# Patient Record
Sex: Female | Born: 1960 | Race: Black or African American | Hispanic: No | Smoking: Current every day smoker
Health system: Southern US, Community
[De-identification: ages and names within clinical notes are randomized; demographics above are authoritative.]

## PROBLEM LIST (undated history)

## (undated) DIAGNOSIS — D219 Benign neoplasm of connective and other soft tissue, unspecified: Secondary | ICD-10-CM

## (undated) DIAGNOSIS — E119 Type 2 diabetes mellitus without complications: Secondary | ICD-10-CM

## (undated) HISTORY — PX: ABDOMINAL HYSTERECTOMY: SHX81

---

## 2000-10-26 ENCOUNTER — Emergency Department (HOSPITAL_COMMUNITY): Admission: EM | Admit: 2000-10-26 | Discharge: 2000-10-26 | Payer: Self-pay | Admitting: Emergency Medicine

## 2000-11-11 ENCOUNTER — Emergency Department (HOSPITAL_COMMUNITY): Admission: EM | Admit: 2000-11-11 | Discharge: 2000-11-11 | Payer: Self-pay

## 2001-12-05 ENCOUNTER — Emergency Department (HOSPITAL_COMMUNITY): Admission: EM | Admit: 2001-12-05 | Discharge: 2001-12-05 | Payer: Self-pay | Admitting: Emergency Medicine

## 2002-01-04 ENCOUNTER — Encounter: Admission: RE | Admit: 2002-01-04 | Discharge: 2002-01-04 | Payer: Self-pay

## 2002-01-26 ENCOUNTER — Emergency Department (HOSPITAL_COMMUNITY): Admission: EM | Admit: 2002-01-26 | Discharge: 2002-01-26 | Payer: Self-pay | Admitting: Emergency Medicine

## 2002-08-16 ENCOUNTER — Encounter: Payer: Self-pay | Admitting: *Deleted

## 2002-08-16 ENCOUNTER — Inpatient Hospital Stay (HOSPITAL_COMMUNITY): Admission: AD | Admit: 2002-08-16 | Discharge: 2002-08-16 | Payer: Self-pay | Admitting: Obstetrics and Gynecology

## 2002-09-26 ENCOUNTER — Encounter: Admission: RE | Admit: 2002-09-26 | Discharge: 2002-09-26 | Payer: Self-pay | Admitting: Obstetrics and Gynecology

## 2002-12-09 ENCOUNTER — Inpatient Hospital Stay (HOSPITAL_COMMUNITY): Admission: RE | Admit: 2002-12-09 | Discharge: 2002-12-11 | Payer: Self-pay | Admitting: Obstetrics and Gynecology

## 2002-12-09 ENCOUNTER — Encounter (INDEPENDENT_AMBULATORY_CARE_PROVIDER_SITE_OTHER): Payer: Self-pay

## 2006-05-11 ENCOUNTER — Emergency Department (HOSPITAL_COMMUNITY): Admission: EM | Admit: 2006-05-11 | Discharge: 2006-05-11 | Payer: Self-pay | Admitting: Emergency Medicine

## 2006-05-20 ENCOUNTER — Emergency Department (HOSPITAL_COMMUNITY): Admission: EM | Admit: 2006-05-20 | Discharge: 2006-05-20 | Payer: Self-pay | Admitting: Emergency Medicine

## 2006-06-15 ENCOUNTER — Emergency Department (HOSPITAL_COMMUNITY): Admission: EM | Admit: 2006-06-15 | Discharge: 2006-06-15 | Payer: Self-pay | Admitting: Emergency Medicine

## 2007-09-21 ENCOUNTER — Emergency Department (HOSPITAL_COMMUNITY): Admission: EM | Admit: 2007-09-21 | Discharge: 2007-09-21 | Payer: Self-pay | Admitting: Family Medicine

## 2009-09-08 ENCOUNTER — Emergency Department (HOSPITAL_COMMUNITY): Admission: EM | Admit: 2009-09-08 | Discharge: 2009-09-08 | Payer: Self-pay | Admitting: Family Medicine

## 2009-11-06 ENCOUNTER — Emergency Department (HOSPITAL_COMMUNITY): Admission: EM | Admit: 2009-11-06 | Discharge: 2009-11-06 | Payer: Self-pay | Admitting: Emergency Medicine

## 2009-12-13 ENCOUNTER — Emergency Department (HOSPITAL_COMMUNITY): Admission: EM | Admit: 2009-12-13 | Discharge: 2009-12-13 | Payer: Self-pay | Admitting: Family Medicine

## 2010-01-21 ENCOUNTER — Encounter: Admission: RE | Admit: 2010-01-21 | Discharge: 2010-01-21 | Payer: Self-pay | Admitting: Infectious Diseases

## 2010-01-25 ENCOUNTER — Ambulatory Visit: Payer: Self-pay | Admitting: Internal Medicine

## 2010-01-27 ENCOUNTER — Ambulatory Visit: Payer: Self-pay | Admitting: Internal Medicine

## 2010-02-24 ENCOUNTER — Ambulatory Visit: Payer: Self-pay | Admitting: Internal Medicine

## 2010-02-24 LAB — CONVERTED CEMR LAB
ALT: 10 units/L (ref 0–35)
AST: 12 units/L (ref 0–37)
BUN: 12 mg/dL (ref 6–23)
CO2: 17 meq/L — ABNORMAL LOW (ref 19–32)
Chloride: 106 meq/L (ref 96–112)
Cholesterol: 151 mg/dL (ref 0–200)
Eosinophils Relative: 1 % (ref 0–5)
Hemoglobin: 13.3 g/dL (ref 12.0–15.0)
MCHC: 30.9 g/dL (ref 30.0–36.0)
Monocytes Absolute: 0.4 10*3/uL (ref 0.1–1.0)
Monocytes Relative: 6 % (ref 3–12)
Neutro Abs: 2.9 10*3/uL (ref 1.7–7.7)
Platelets: 328 10*3/uL (ref 150–400)
RBC: 4.83 M/uL (ref 3.87–5.11)
RDW: 14.6 % (ref 11.5–15.5)
Sodium: 140 meq/L (ref 135–145)
Total Bilirubin: 0.8 mg/dL (ref 0.3–1.2)
Total Protein: 6.8 g/dL (ref 6.0–8.3)
Triglycerides: 51 mg/dL (ref <150)

## 2010-03-31 ENCOUNTER — Ambulatory Visit: Payer: Self-pay | Admitting: Internal Medicine

## 2010-07-28 ENCOUNTER — Ambulatory Visit: Payer: Self-pay | Admitting: Internal Medicine

## 2011-03-16 LAB — POCT URINALYSIS DIP (DEVICE)
Bilirubin Urine: NEGATIVE
Hgb urine dipstick: NEGATIVE
Nitrite: NEGATIVE
Protein, ur: NEGATIVE mg/dL
Specific Gravity, Urine: 1.015 (ref 1.005–1.030)
Urobilinogen, UA: 1 mg/dL (ref 0.0–1.0)

## 2011-04-29 NOTE — Op Note (Signed)
Valerie Sanders, VANSTONE                            ACCOUNT NO.:  [id number]   MEDICAL RECORD NO.:  [id number]                   PATIENT TYPE:  INP   LOCATION:  9127                                 FACILITY:  [facility]   PHYSICIAN:  Phil D. Okey Dupre, M.D.                  DATE OF BIRTH:  [birth year]   DATE OF PROCEDURE:  12/09/2002  DATE OF DISCHARGE:                                 OPERATIVE REPORT   PREOPERATIVE DIAGNOSES:  1. Menorrhagia.  2. Dysmenorrhea.  3. Symptomatic fibroids.   POSTOPERATIVE DIAGNOSES:  1. Menorrhagia.  2. Dysmenorrhea.  3. Symptomatic fibroids.  4. Pending pathology report.   PROCEDURE:  Total vaginal hysterectomy.   SURGEON:  Javier Glazier. Okey Dupre, M.D.   FIRST ASSISTANT:  Mary Sella. Orlene Erm, M.D.   SECOND ASSISTANT:  Operating room nurse.   FINDINGS:  A slightly irregular uterus smaller than described by sonography  and soft in texture.   DESCRIPTION OF PROCEDURE:  Under satisfactory general anesthesia with the  patient in the dorsal lithotomy position, the perineum and vagina were  prepped and draped in the usual sterile manner.  Weighted speculum was  placed in the posterior portion of the vagina through meatal introitus.  The  BUS was within normal limits.  The vagina was clean and well rugated.  The  cervix was parous and clean with a first degree prolapse. The cervix was  grasped by the anterior and posterior lip each with a Lahey clamp and the  mucosa around the cervix was injected with a total of 10 cc 1% Xylocaine to  1:100,000 epinephrine.  Circumferential incision was then made around the  entire circumference of the cervix 1 cm from its distal end.  Vaginal mucosa  was then pushed away from the distal end of the cervix by blunt dissection.  The cul-de-sac at [facility] was entered by sharp dissection and a figure-of-  eight suture placed through this through the vaginal mucosa and through the  peritoneum for hemostasis and traction.  Circumferentially, using  Lahey  clamps, the uterine vessels were clamped, divided and ligated with warm  chromic catgut suture ligatures.  These pedicles included (1) the  uterosacral ligaments, (2) cardinal ligaments, (3) the packet containing  uterine vessels, and (4) tissue between the area of the uterine vessels  containing a portion of the broad ligament up to the round ligament.  At  this point, the peritoneal cavity was reentered beneath the bladder and free  tie placed through this opening and tied around the remaining pedicle which  included the round ligament and the fallopian tubes and the utero-ovarian  ligament.  A Heaney clamp was then placed medial to the aforementioned tie.  The tissue medially was divided, thus removing the uterus in toto.  The  lateral pedicles were religated with #1 chromic catgut suture ligatures.  The area was observed for bleeding.  None  was noted and the peritoneum was  closed with a pursestring suture of one chromic catgut.  The vaginal mucosa  was then closed with interrupted figure-of-eight sutures for hemostasis.  The uterosacral ligaments were then tied in the midline with the sutures  that had been held long and then these were cut short.  The same was done  with utero-ovarian, these were cut short.  A vaginal pack was placed in the  vagina with Iodoform gauze.  A Foley catheter in the urinary bladder  draining clear amber urine at the end of the procedure.  The total blood  loss during the procedure was 100 cc.  The sponge, needle and instrument  counts were correct at the end of the procedure.  The patient was  transferred to the recovery room in satisfactory condition.  Description of  specimens removed.  As aforementioned, the uterus was slightly smaller than  had been appreciated on sonogram, was generally normal shape.  There was  some small globular out pocketings of small fibroids.  Otherwise, appeared  normal.                                                Phil D. Okey Dupre, M.D.    PDR/MEDQ  D:  12/09/2002  T:  12/09/2002  Job:  [id number]

## 2011-04-29 NOTE — H&P (Signed)
Valerie Sanders, Valerie Sanders                            ACCOUNT NO.:  [id number]   MEDICAL RECORD NO.:  [id number]                   PATIENT TYPE:  INP   LOCATION:  NA                                   FACILITY:  [facility]   PHYSICIAN:  Phil D. Okey Dupre, M.D.                  DATE OF BIRTH:  1961-11-26   DATE OF ADMISSION:  12/09/2002  DATE OF DISCHARGE:                                HISTORY & PHYSICAL   CHIEF COMPLAINT:  Severe painful periods.   HISTORY OF PRESENT ILLNESS:  This is a 50 year old gravida 3 para 3-0-0-3  who has had disabling periods that requires going to bed over the past nine  months.  The patient is a heavy smoker so oral contraceptives could not be  used to try and control the pain and they were unhelped by NSAIDS.  The  patient is being admitted for a vaginal hysterectomy.  A sonogram shows a 4  cm fibroid on the uterus.   ALLERGIES:  The patient has no allergies known.   MEDICATIONS:  She is on no medications on a regular basis.   PAST HISTORY:  Bilateral tubal ligation and three pregnancies with normal  vaginal deliveries.   FAMILY HISTORY:  Parents are living and well as well as several brothers and  sisters.  She has a family history of hypertension but all are in fairly  good health.   REVIEW OF SYSTEMS:  GENERAL:  Overall stated health is good.  She has  fatigue which she explains by her weight and her pain with periods.  She  does not run fevers and have night sweats.  She is able to carry out normal  activities with the exception of the time she is having a period.  INTEGUMENTARY:  No sign of eruptions, rashes, pruritus, pigmentation,  unusual hair growth or disorders, deformity of nails.  HEAD:  No history of  headaches, no head trauma history.  EYES:  No visual problems, no lid edema,  no excessive tearing, no dry eyes.  EARS:  Hearing is normal.  No history of  discharge.  NOSE:  No history of nose bleeds or sinusitis.  No history of  nasal obstruction.   Normal smell.  MOUTH:  Teeth and gums with fairly poor  dentition; it has been over two years since she saw a dentist.  THROAT AND  NECK:  Rare sore throat.  No neck stiffness or pain.  BREASTS:  No  discharge, no bleeding, and no lumps being noticed.  RESPIRATORY:  No  history of wheezing or shortness of breath.  No orthopnea, no heavy sputum  production.  CARDIOVASCULAR:  No history of chest pain, palpitations, heart  murmur, peripheral edema, hypertension, or thrombophlebitis.  GASTROINTESTINAL:  No dysphagia, no loss of appetite.  Normal dietary  habits.  No constant laxative.  Normal bowel movement every day.  GENITOURINARY:  Occasional urinary frequency but no dysuria.  Occasional  nocturia once or twice a night.  No urinary incontinence.  GYNECOLOGICAL:  See present illness.  OBSTETRICAL:  Three full-term pregnancies.  No  abortions, no miscarriages.  MUSCULOSKELETAL:  No weakness or neck pain.  Occasional joint stiffness.  NEUROPSYCHIATRIC:  No seizures.  No  paresthesias, hyperesthesias.  No depression, no nervousness.  LYMPHATIC:  No lymph node swelling.  No bruising, edema.  No history of blood  transfusions.  ENDOCRINE:  No signs of hyperthyroidism or hypothyroidism.  No history of diabetes.  No polydipsia or polyuria.  No excessive sweating  and no voice changes.   PHYSICAL EXAMINATION:  VITAL SIGNS:  Temperature 98.8, respirations 14 per  minute, pulse 68 per minute, blood pressure 120/60.  GENERAL:  She is a well-developed, well-nourished black female in no  apparent distress.  HEENT:  Within normal limits.  NECK:  Supple.  Thyroid is symmetrical with no masses.  LUNGS:  Clear to auscultation and percussion.  HEART:  There is no murmur.  Normal sinus rhythm.  Point of maximum impulse  is the fifth intercostal space in mid clavicular line.  BREASTS:  Symmetrical with no dominant mass and no nipple discharge noted.  ABDOMEN:  Soft, flat, nontender.  No masses, no  organomegaly.  No adenopathy  noted.  PELVIC:  Genitalia:  External within normal limits.  Introitus is marital.  The vagina is clean and well rugated.  The cervix is parous and clean.  The  uterus is retroverted, slightly enlarged and irregular.  Adnexa is normal.  RECTAL:  The rectum was normal with no masses and a negative hemoccult.   SPECIAL ASSESSMENT:  A sonogram showed a 4 cm fibroid.   IMPRESSION:  Symptomatic leiomyomata uteri with chronic disabling  dysmenorrhea.   PLAN:  Vaginal hysterectomy, possible abdominal hysterectomy.                                               Phil D. Okey Dupre, M.D.    PDR/MEDQ  D:  12/07/2002  T:  12/07/2002  Job:  [id number]

## 2011-04-29 NOTE — Discharge Summary (Signed)
   Valerie Sanders, Valerie Sanders                            ACCOUNT NO.:  [id number]   MEDICAL RECORD NO.:  [id number]                   PATIENT TYPE:  INP   LOCATION:  9127                                 FACILITY:  [facility]   PHYSICIAN:  Phil D. Okey Dupre, M.D.                  DATE OF BIRTH:  [birth year]   DATE OF ADMISSION:  12/09/2002  DATE OF DISCHARGE:  12/11/2002                                 DISCHARGE SUMMARY   HISTORY OF PRESENT ILLNESS:  The patient is a 50 year old black female who  was admitted for vaginal hysterectomy which was done on the day of  admission.  She had a long history of disabling dysmenorrhea as well as  menorrhagia with secondary anemia.  The patient's admission hemoglobin was  7.9 and at discharge was 6.5.  The patient has done extremely well with an  afebrile postoperative course.  Physical examination at discharge the  abdomen is soft, flat, nontender with no guarding or rebound.  No genital  bleeding.  The patient is ambulating well and has been using minimal amount  of pain medication.  At the time of discharge the postoperative pathology  has not yet been completed.  The postoperative diagnosis was symptomatic  fibroids.  My impression is that the patient possibly had adenomyosis.   CONDITION ON DISCHARGE:  Excellent.   DISCHARGE INSTRUCTIONS:  Instructions have been given to the patient as far  as activity, especially to reduce the heavy lifting and stairs for the first  two weeks at home.  She works in a [employer] and we told her she could  probably go to work after the first of February.  She is being discharged  with Percocet and Motrin for pain, iron supplement for anemia.  As the  patient wishes, we will prescribe Wellbutrin as she is a two-pack a day  smoker who is really motivated, I think, to stop smoking.   In review, the patient had a total vaginal hysterectomy on the day of  admission.  This is her second postoperative day.  She has done very well.  She  has persistent anemia, the major portion which was preoperative and will  be followed in the GYN Clinic in four weeks.                                               Phil D. Okey Dupre, M.D.    PDR/MEDQ  D:  12/11/2002  T:  12/11/2002  Job:  [id number]

## 2011-11-09 ENCOUNTER — Emergency Department (HOSPITAL_COMMUNITY)
Admission: EM | Admit: 2011-11-09 | Discharge: 2011-11-09 | Disposition: A | Discharge status: Home / Self Care | Payer: Self-pay | Source: Home / Self Care | Attending: Emergency Medicine | Admitting: Emergency Medicine

## 2011-11-09 DIAGNOSIS — J029 Acute pharyngitis, unspecified: Secondary | ICD-10-CM

## 2011-11-09 HISTORY — DX: Benign neoplasm of connective and other soft tissue, unspecified: D21.9

## 2011-11-09 LAB — POCT RAPID STREP A: Streptococcus, Group A Screen (Direct): NEGATIVE

## 2011-11-09 MED ORDER — ACETAMINOPHEN 80 MG/0.8ML PO SUSP
1000.0000 mg | Freq: Once | ORAL | Status: AC
  Administered 2011-11-09: 1000 mg via ORAL

## 2011-11-09 MED ORDER — GI COCKTAIL ~~LOC~~
30.0000 mL | Freq: Once | ORAL | Status: AC
  Administered 2011-11-09: 30 mL via ORAL

## 2011-11-09 MED ORDER — FAMOTIDINE 20 MG PO TABS: 20.0000 mg | ORAL_TABLET | Freq: Every day | ORAL | Status: AC

## 2011-11-09 MED ORDER — SUCRALFATE 1 GM/10ML PO SUSP: 0.5000 g | Freq: Four times a day (QID) | ORAL | Status: AC

## 2011-11-09 MED ORDER — FIRST-DUKES MOUTHWASH MT SUSP
10.0000 mL | Freq: Four times a day (QID) | OROMUCOSAL | Status: AC | PRN
Start: 2011-11-09 — End: ?

## 2011-11-09 MED ORDER — GI COCKTAIL ~~LOC~~
ORAL | Status: AC
  Filled 2011-11-09: qty 30

## 2011-11-09 NOTE — ED Provider Notes (Signed)
History     CSN: [id number] Arrival date & time: 11/09/2011  5:49 PM   First MD Initiated Contact with Patient 11/09/11 1746      Chief Complaint  Patient presents with  . Sore Throat    (Consider location/radiation/quality/duration/timing/severity/associated sxs/prior treatment) HPI Comments: Pt reports prolonged episodes of coughing last night from URI/postnasal drip that she has had for approx a week. Today woke up with severe sore throat, pain with swallowing. No CP, SOB, wheezing, no voice changes, fevers, drooling, inability to swallow, neck pain, facial swelling, neck swelling. No rash, abd pain. No known sick contacts. Pt is smoker. H/o intranasal cocaine, heroin use. Denies use in 2 years.   Patient is a 50 y.o. female presenting with pharyngitis. The history is provided by the patient.  Sore Throat This is a new problem. The current episode started 6 to 12 hours ago. The problem occurs constantly. The problem has not changed since onset.Pertinent negatives include no chest pain, no abdominal pain, no headaches and no shortness of breath. The symptoms are aggravated by swallowing. The symptoms are relieved by nothing. She has tried nothing for the symptoms.    Past Medical History  Diagnosis Date  . Fibroids     History reviewed. No pertinent past surgical history.  History reviewed. No pertinent family history.  History  Substance Use Topics  . Smoking status: Current Everyday Smoker  . Smokeless tobacco: Not on file  . Alcohol Use: No    OB History    Grav Para Term Preterm Abortions TAB SAB Ect Mult Living                  Review of Systems  Constitutional: Negative for fatigue.  HENT: Positive for sore throat, rhinorrhea, trouble swallowing and postnasal drip. Negative for ear pain, facial swelling, mouth sores, neck pain, dental problem and voice change.   Respiratory: Negative for shortness of breath and wheezing.   Cardiovascular: Negative for chest  pain.  Gastrointestinal: Negative for abdominal pain.  Skin: Negative for rash.  Neurological: Negative for headaches.    Allergies  Review of patient's allergies indicates no known allergies.  Home Medications   Current Outpatient Rx  Name Route Sig Dispense Refill  . FIRST-DUKES MOUTHWASH MT SUSP Mouth/Throat Use as directed 10 mLs in the mouth or throat 4 (four) times daily as needed. 237 mL 0  . FAMOTIDINE 20 MG PO TABS Oral Take 1 tablet (20 mg total) by mouth daily. 20 tablet 0  . SUCRALFATE 1 GM/10ML PO SUSP Oral Take 5 mLs (0.5 g total) by mouth 4 (four) times daily. 240 mL 0    BP 115/70  Pulse 90  Temp(Src) 97.9 F (36.6 C) (Oral)  Resp 16  SpO2 99%  Physical Exam  Nursing note and vitals reviewed. Constitutional: She is oriented to person, place, and time. She appears well-developed and well-nourished. No distress.  HENT:  Head: Normocephalic and atraumatic.  Right Ear: Tympanic membrane normal.  Left Ear: Tympanic membrane normal.  Nose: Mucosal edema, rhinorrhea and nasal deformity present.  Mouth/Throat: Uvula is midline and mucous membranes are normal. No uvula swelling. Posterior oropharyngeal erythema present. No oropharyngeal exudate, posterior oropharyngeal edema or tonsillar abscesses.       Perforated septum. No sinus tenderness.   Eyes: Conjunctivae and EOM are normal. Pupils are equal, round, and reactive to light.  Neck: Normal range of motion. Neck supple.       Pharynx widely patent.   Cardiovascular: Normal  rate, regular rhythm and normal heart sounds.   Pulmonary/Chest: Effort normal and breath sounds normal.  Abdominal: She exhibits no distension.  Musculoskeletal: Normal range of motion.  Lymphadenopathy:    She has no cervical adenopathy.  Neurological: She is alert and oriented to person, place, and time.  Skin: Skin is warm and dry.  Psychiatric: She has a normal mood and affect. Her behavior is normal. Judgment and thought content  normal.    ED Course  Procedures (including critical care time)   Labs Reviewed  POCT RAPID STREP A ([facility] ONLY)   No results found.   1. Pharyngitis   2. Esophagitis     Results for orders placed during the hospital encounter of 11/09/11  POCT RAPID STREP A ([facility] ONLY)      Component Value Range   Streptococcus, Group A Screen (Direct) NEGATIVE  NEGATIVE      MDM  Appears to be irritation from prolonged coughing from postnasal drip. Also suspect may have had some reflux with increased intraabd pressure. Strep neg, no evidence of mono, Lungs, pharynx clear, normal voice. txing with sucralafate and H2 blocker, [facility]. Will refer to PMD.   Luiz Blare, MD 11/09/11 Windell Moment

## 2011-11-09 NOTE — ED Notes (Signed)
C/o St since this AM, c/o she cannot eat or drink and can not swallow ; no observable drooling, handling secretions well, post nasopharynx reddened, minor swelling noted, no exudate observed; c/o her throat hurts from top to her stomach  ; has not taken any pain medication at home for this problem

## 2012-04-12 ENCOUNTER — Emergency Department (HOSPITAL_COMMUNITY)
Admission: EM | Admit: 2012-04-12 | Discharge: 2012-04-12 | Discharge status: Against Medical Advice | Payer: Self-pay | Source: Home / Self Care

## 2012-04-12 NOTE — ED Notes (Signed)
Patient not in treatment room, in bathroom 

## 2012-04-12 NOTE — ED Notes (Signed)
Pt stated she had to leave to go to work. RN Selena Batten informed

## 2012-10-01 ENCOUNTER — Emergency Department (INDEPENDENT_AMBULATORY_CARE_PROVIDER_SITE_OTHER)
Admission: EM | Admit: 2012-10-01 | Discharge: 2012-10-01 | Disposition: A | Discharge status: Home / Self Care | Payer: Self-pay | Source: Home / Self Care | Attending: Emergency Medicine | Admitting: Emergency Medicine

## 2012-10-01 ENCOUNTER — Encounter (HOSPITAL_COMMUNITY): Payer: Self-pay | Admitting: *Deleted

## 2012-10-01 DIAGNOSIS — N390 Urinary tract infection, site not specified: Secondary | ICD-10-CM

## 2012-10-01 HISTORY — DX: Type 2 diabetes mellitus without complications: E11.9

## 2012-10-01 LAB — POCT URINALYSIS DIP (DEVICE): Nitrite: NEGATIVE

## 2012-10-01 MED ORDER — CEPHALEXIN 500 MG PO CAPS: 500.0000 mg | ORAL_CAPSULE | Freq: Three times a day (TID) | ORAL | Status: AC

## 2012-10-01 NOTE — ED Notes (Signed)
PT  REPORTS  SYMPTOMS  OF  PAINFULL  FREQUENT  URINATION     IN  WHICH  SHE  NOTICED  BLOOD     X  2  DAYS        SHE  DENYS  ANY OTHER  SYMPTOMS  SHE  IS  AWAKE  AS  WELL  AS  ALE RT  AND  PLEASANT  SHE  IS  IN NO    SEVERE  DISTRESS

## 2012-10-01 NOTE — ED Provider Notes (Signed)
History     CSN: [id number]  Arrival date & time 10/01/12  4098   First MD Initiated Contact with Patient 10/01/12 0932      Chief Complaint  Patient presents with  . Urinary Frequency    (Consider location/radiation/quality/duration/timing/severity/associated sxs/prior treatment) HPI Comments: Patient presents urgent care complaining of discomfort and increased frequency with urination for the last 2 days. Describes no fevers, no vomiting or flank pain. She denies any abdominal pain, changes in appetite, vaginal discharge or bleeding.  Patient is a 51 y.o. female presenting with frequency. The history is provided by the patient.  Urinary Frequency This is a new problem. The current episode started 2 days ago. The problem occurs constantly. The problem has not changed since onset.Pertinent negatives include no abdominal pain. Exacerbated by: Urinating. Nothing relieves the symptoms. She has tried nothing for the symptoms. The treatment provided no relief.    Past Medical History  Diagnosis Date  . Fibroids   . Diabetes mellitus without complication     Past Surgical History  Procedure Date  . Abdominal hysterectomy     No family history on file.  History  Substance Use Topics  . Smoking status: Current Every Day Smoker  . Smokeless tobacco: Not on file  . Alcohol Use: No    OB History    Grav Para Term Preterm Abortions TAB SAB Ect Mult Living                  Review of Systems  Constitutional: Negative for fever, chills, diaphoresis, activity change, appetite change and fatigue.  Gastrointestinal: Negative for abdominal pain.  Genitourinary: Positive for dysuria, urgency and frequency. Negative for hematuria, flank pain, decreased urine volume, vaginal bleeding, vaginal discharge, difficulty urinating, genital sores, vaginal pain, menstrual problem and pelvic pain.  Skin: Negative for rash.    Allergies  Review of patient's allergies indicates no known  allergies.  Home Medications   Current Outpatient Rx  Name Route Sig Dispense Refill  . CEPHALEXIN 500 MG PO CAPS Oral Take 1 capsule (500 mg total) by mouth 3 (three) times daily. 21 capsule 0  . FIRST-DUKES MOUTHWASH MT SUSP Mouth/Throat Use as directed 10 mLs in the mouth or throat 4 (four) times daily as needed. 237 mL 0  . FAMOTIDINE 20 MG PO TABS Oral Take 1 tablet (20 mg total) by mouth daily. 20 tablet 0    BP 108/61  Pulse 90  Temp 98.1 F (36.7 C) (Oral)  Resp 16  SpO2 100%  Physical Exam  Nursing note and vitals reviewed. Constitutional: Vital signs are normal. She appears well-developed and well-nourished.  Non-toxic appearance. She does not have a sickly appearance. She does not appear ill. No distress.  Abdominal: Soft. She exhibits no distension and no mass. There is tenderness. There is no rebound and no guarding.  Genitourinary: Vagina normal. No erythema, tenderness or bleeding around the vagina. No foreign body around the vagina. No vaginal discharge found.  Skin: No rash noted. No erythema.    ED Course  Procedures (including critical care time)  Labs Reviewed  POCT URINALYSIS DIP (DEVICE) - Abnormal; Notable for the following:    Bilirubin Urine SMALL (*)     Ketones, ur TRACE (*)     Hgb urine dipstick LARGE (*)     Protein, ur 100 (*)     Leukocytes, UA MODERATE (*)  Biochemical Testing Only. Please order routine urinalysis from main lab if confirmatory testing is needed.  All other components within normal limits  URINE CULTURE   No results found.   1. Urinary tract infection       MDM  Uncomplicated urinary tract infection. Patient prescribe a course of Keflex for 7 days. Lortab symptoms that should warrant her return for a recheck.      Jimmie Molly, MD 10/01/12 1005

## 2012-10-03 LAB — URINE CULTURE
Colony Count: 75000
Special Requests: NORMAL

## 2012-10-03 NOTE — ED Notes (Addendum)
Urine culture: 75,000 colonies Proteus Mirabilis.  Pt. adequately treated with Keflex. Vassie Moselle 10/03/2012

## 2013-04-29 ENCOUNTER — Other Ambulatory Visit: Payer: Self-pay

## 2013-04-29 DIAGNOSIS — Z1231 Encounter for screening mammogram for malignant neoplasm of breast: Secondary | ICD-10-CM

## 2013-05-02 ENCOUNTER — Other Ambulatory Visit: Payer: Self-pay | Admitting: Internal Medicine

## 2013-05-02 DIAGNOSIS — E2839 Other primary ovarian failure: Secondary | ICD-10-CM

## 2013-06-05 ENCOUNTER — Ambulatory Visit: Payer: Self-pay

## 2013-06-05 ENCOUNTER — Other Ambulatory Visit: Payer: Self-pay

## 2013-06-06 ENCOUNTER — Ambulatory Visit
Admission: RE | Admit: 2013-06-06 | Discharge: 2013-06-06 | Disposition: A | Discharge status: Home / Self Care | Payer: BC Managed Care – PPO | Source: Ambulatory Visit

## 2013-06-06 ENCOUNTER — Ambulatory Visit
Admission: RE | Admit: 2013-06-06 | Discharge: 2013-06-06 | Disposition: A | Discharge status: Home / Self Care | Payer: BC Managed Care – PPO | Source: Ambulatory Visit | Attending: Internal Medicine | Admitting: Internal Medicine

## 2013-06-06 DIAGNOSIS — Z1231 Encounter for screening mammogram for malignant neoplasm of breast: Secondary | ICD-10-CM

## 2013-06-06 DIAGNOSIS — E2839 Other primary ovarian failure: Secondary | ICD-10-CM

## 2014-03-27 ENCOUNTER — Emergency Department (HOSPITAL_COMMUNITY)
Admission: EM | Admit: 2014-03-27 | Discharge: 2014-03-27 | Disposition: A | Discharge status: Home / Self Care | Payer: BC Managed Care – PPO | Attending: Emergency Medicine | Admitting: Emergency Medicine

## 2014-03-27 ENCOUNTER — Encounter (HOSPITAL_COMMUNITY): Payer: Self-pay | Admitting: Emergency Medicine

## 2014-03-27 DIAGNOSIS — F17200 Nicotine dependence, unspecified, uncomplicated: Secondary | ICD-10-CM | POA: Insufficient documentation

## 2014-03-27 DIAGNOSIS — E119 Type 2 diabetes mellitus without complications: Secondary | ICD-10-CM | POA: Insufficient documentation

## 2014-03-27 DIAGNOSIS — M545 Low back pain, unspecified: Secondary | ICD-10-CM | POA: Insufficient documentation

## 2014-03-27 DIAGNOSIS — Z8742 Personal history of other diseases of the female genital tract: Secondary | ICD-10-CM | POA: Insufficient documentation

## 2014-03-27 DIAGNOSIS — R0789 Other chest pain: Secondary | ICD-10-CM | POA: Insufficient documentation

## 2014-03-27 DIAGNOSIS — M549 Dorsalgia, unspecified: Secondary | ICD-10-CM

## 2014-03-27 DIAGNOSIS — Z79899 Other long term (current) drug therapy: Secondary | ICD-10-CM | POA: Insufficient documentation

## 2014-03-27 MED ORDER — CYCLOBENZAPRINE HCL 10 MG PO TABS: 10.0000 mg | ORAL_TABLET | Freq: Two times a day (BID) | ORAL | Status: DC | PRN

## 2014-03-27 MED ORDER — NAPROXEN 500 MG PO TABS: 500.0000 mg | ORAL_TABLET | Freq: Two times a day (BID) | ORAL | Status: DC

## 2014-03-27 MED ORDER — NAPROXEN 250 MG PO TABS
500.0000 mg | ORAL_TABLET | Freq: Once | ORAL | Status: AC
  Administered 2014-03-27: 500 mg via ORAL
  Filled 2014-03-27: qty 2

## 2014-03-27 NOTE — ED Notes (Signed)
Pt c/o low back pain x  1 week. States before that, she had left chest pain and left arm "tingling". States the chest pain left her chest and went down to her back. Denies SOB or N/V

## 2014-03-27 NOTE — Discharge Instructions (Signed)
Take Naprosyn as needed for pain. Take Flexeril as needed for muscle spasm. You may take these medications together. Refer to attached documents for more information. Return to the ED with worsening or concerning symptoms. Follow up with your doctor as scheduled.

## 2014-03-27 NOTE — ED Notes (Signed)
Pt c/o lower back pain x 3 days. No noted activity at onset. She lifts heavy items at work. No bowel/bladder changes

## 2014-03-27 NOTE — ED Provider Notes (Signed)
CSN: [id number]     Arrival date & time 03/27/14  1206 History  This chart was scribed for non-physician practitioner, Alvina Chou, PA-C working with Alfonzo Feller, DO by Frederich Balding, ED scribe. This patient was seen in room TR09C/TR09C and the patient's care was started at 12:39 PM.   Chief Complaint  Patient presents with  . Back Pain   The history is provided by the patient. No language interpreter was used.   HPI Comments: Valerie Sanders is a 53 y.o. female who presents to the Emergency Department complaining of gradual onset, constant lower back pain that started one week ago. Pt states she lifts heavy items at work but denies injury. Movement worsens the pain and makes it radiate around her sides. She has not taken any medications at home to alleviate the pain. Pt is also having intermittent chest pains that are reproducible with palpation. Denies SOB.   Past Medical History  Diagnosis Date  . Fibroids   . Diabetes mellitus without complication    Past Surgical History  Procedure Laterality Date  . Abdominal hysterectomy     History reviewed. No pertinent family history. History  Substance Use Topics  . Smoking status: Current Every Day Smoker  . Smokeless tobacco: Not on file  . Alcohol Use: No   OB History   Grav Para Term Preterm Abortions TAB SAB Ect Mult Living                 Review of Systems  Respiratory: Negative for shortness of breath.   Cardiovascular: Positive for chest pain.  Musculoskeletal: Positive for back pain.  All other systems reviewed and are negative.  Allergies  Review of patient's allergies indicates no known allergies.  Home Medications   Prior to Admission medications   Medication Sig Start Date End Date Taking? Authorizing Provider  Diphenhyd-Hydrocort-Nystatin (FIRST-DUKES MOUTHWASH) SUSP Use as directed 10 mLs in the mouth or throat 4 (four) times daily as needed. 11/09/11   Melynda Ripple, MD  famotidine (PEPCID) 20 MG  tablet Take 1 tablet (20 mg total) by mouth daily. 11/09/11 11/08/12  Melynda Ripple, MD   BP 118/71  Pulse 69  Temp(Src) 97.4 F (36.3 C) (Oral)  Resp 16  Ht 5\' 8"  (1.727 m)  Wt 160 lb (72.576 kg)  BMI 24.33 kg/m2  SpO2 98%  Physical Exam  Nursing note and vitals reviewed. Constitutional: She is oriented to person, place, and time. She appears well-developed and well-nourished. No distress.  HENT:  Head: Normocephalic and atraumatic.  Eyes: EOM are normal.  Neck: Neck supple. No tracheal deviation present.  Cardiovascular: Normal rate.   Pulmonary/Chest: Effort normal. No respiratory distress.  Left anterior chest tenderness to palpation. No obvious deformity.   Musculoskeletal: Normal range of motion.  No midline spine tenderness to palpation. Bilateral paraspinal muscle tenderness to palpation.   Neurological: She is alert and oriented to person, place, and time.  Skin: Skin is warm and dry.  Psychiatric: She has a normal mood and affect. Her behavior is normal.    ED Course  Procedures (including critical care time)   Date: 03/27/2014  Rate: 79  Rhythm: normal sinus rhythm  QRS Axis: normal  Intervals: normal  ST/T Wave abnormalities: normal  Conduction Disutrbances:none  Narrative Interpretation: NSR without acute changes  Old EKG Reviewed: none available    DIAGNOSTIC STUDIES: Oxygen Saturation is 98% on RA, normal by my interpretation.    COORDINATION OF CARE: 12:41 PM-Discussed treatment plan which includes  an anti-inflammatory, a muscle relaxer and warm compresses with pt at bedside and pt agreed to plan.   Labs Review Labs Reviewed - No data to display  Imaging Review No results found.   EKG Interpretation None      MDM   Final diagnoses:  Back pain    12:48 PM Patient likely having muscle pain. Patient denies any injury at this time. No imaging needed. EKG unremarkable for acute changes. Patient will have naprosyn and flexeril for pain.  Vitals stable and patient afebrile. Patient will follow up with her PCP.   I personally performed the services described in this documentation, which was scribed in my presence. The recorded information has been reviewed and is accurate.  Alvina Chou, PA-C 03/27/14 1250

## 2014-03-28 NOTE — ED Provider Notes (Signed)
Medical screening examination/treatment/procedure(s) were performed by non-physician practitioner and as supervising physician I was immediately available for consultation/collaboration.   EKG Interpretation None        Alfonzo Feller, DO 03/28/14 [phone]

## 2015-05-04 ENCOUNTER — Encounter: Payer: Self-pay | Admitting: Urgent Care

## 2015-05-04 DIAGNOSIS — M79674 Pain in right toe(s): Secondary | ICD-10-CM | POA: Insufficient documentation

## 2015-05-04 DIAGNOSIS — E119 Type 2 diabetes mellitus without complications: Secondary | ICD-10-CM | POA: Insufficient documentation

## 2015-05-04 DIAGNOSIS — Z720 Tobacco use: Secondary | ICD-10-CM | POA: Insufficient documentation

## 2015-05-04 NOTE — ED Notes (Signed)
Patient presents with c/o pain to RIGHT foot - 4th digit x 2-3 weeks. Patient reports that she has been seeing "some black" on it. Patient advised that she was borderline diabetic.

## 2015-05-05 ENCOUNTER — Emergency Department
Admission: EM | Admit: 2015-05-05 | Discharge: 2015-05-05 | Disposition: A | Discharge status: Home / Self Care | Payer: BLUE CROSS/BLUE SHIELD | Attending: Emergency Medicine | Admitting: Emergency Medicine

## 2015-05-06 ENCOUNTER — Emergency Department
Admission: EM | Admit: 2015-05-06 | Discharge: 2015-05-06 | Disposition: A | Discharge status: Home / Self Care | Payer: BLUE CROSS/BLUE SHIELD | Attending: Emergency Medicine | Admitting: Emergency Medicine

## 2015-05-06 ENCOUNTER — Emergency Department: Payer: BLUE CROSS/BLUE SHIELD

## 2015-05-06 ENCOUNTER — Encounter: Payer: Self-pay | Admitting: Emergency Medicine

## 2015-05-06 DIAGNOSIS — Z720 Tobacco use: Secondary | ICD-10-CM | POA: Insufficient documentation

## 2015-05-06 DIAGNOSIS — M79671 Pain in right foot: Secondary | ICD-10-CM | POA: Diagnosis present

## 2015-05-06 DIAGNOSIS — E119 Type 2 diabetes mellitus without complications: Secondary | ICD-10-CM | POA: Diagnosis not present

## 2015-05-06 DIAGNOSIS — B353 Tinea pedis: Secondary | ICD-10-CM | POA: Insufficient documentation

## 2015-05-06 DIAGNOSIS — Z79899 Other long term (current) drug therapy: Secondary | ICD-10-CM | POA: Diagnosis not present

## 2015-05-06 MED ORDER — CEPHALEXIN 500 MG PO CAPS: 500.0000 mg | ORAL_CAPSULE | Freq: Four times a day (QID) | ORAL | Status: DC

## 2015-05-06 MED ORDER — IBUPROFEN 800 MG PO TABS: 800.0000 mg | ORAL_TABLET | Freq: Three times a day (TID) | ORAL | Status: AC | PRN

## 2015-05-06 MED ORDER — TERBINAFINE HCL 1 % EX CREA: 1.0000 "application " | TOPICAL_CREAM | Freq: Two times a day (BID) | CUTANEOUS | Status: AC

## 2015-05-06 NOTE — ED Notes (Signed)
Pt reports that she is having right third toe pain. There is a black discoloration between her toes. Does not appear necrotic. No/s/s of infection. Skin is intact. She is able to ambulate and bear weight without difficulty.

## 2015-05-06 NOTE — ED Provider Notes (Signed)
[facility] Emergency Department Provider Note  ____________________________________________  Time seen: Approximately 9:33 AM  I have reviewed the triage vital signs and the nursing notes.   HISTORY  Chief Complaint No chief complaint on file.    HPI Valerie Sanders is a 54 y.o. female resents with complaints of right foot pain interdigitally between the third and fourth digit. She states that the pains but never about 2-3 weeks progressively getting worse. Numbness of black discoloration. Denies any known trauma. History of diabetes.   Past Medical History  Diagnosis Date  . Fibroids   . Diabetes mellitus without complication     There are no active problems to display for this patient.   Past Surgical History  Procedure Laterality Date  . Abdominal hysterectomy      Current Outpatient Rx  Name  Route  Sig  Dispense  Refill  . cephALEXin (KEFLEX) 500 MG capsule   Oral   Take 1 capsule (500 mg total) by mouth 4 (four) times daily.   40 capsule   0   . cyclobenzaprine (FLEXERIL) 10 MG tablet   Oral   Take 1 tablet (10 mg total) by mouth 2 (two) times daily as needed for muscle spasms.   20 tablet   0   . Diphenhyd-Hydrocort-Nystatin (FIRST-DUKES MOUTHWASH) SUSP   Mouth/Throat   Use as directed 10 mLs in the mouth or throat 4 (four) times daily as needed.   237 mL   0   . EXPIRED: famotidine (PEPCID) 20 MG tablet   Oral   Take 1 tablet (20 mg total) by mouth daily.   20 tablet   0   . ibuprofen (ADVIL,MOTRIN) 800 MG tablet   Oral   Take 1 tablet (800 mg total) by mouth every 8 (eight) hours as needed.   30 tablet   0   . naproxen (NAPROSYN) 500 MG tablet   Oral   Take 1 tablet (500 mg total) by mouth 2 (two) times daily with a meal.   30 tablet   0   . terbinafine (LAMISIL) 1 % cream   Topical   Apply 1 application topically 2 (two) times daily.   30 g   0     Allergies Review of patient's allergies indicates no known  allergies.  History reviewed. No pertinent family history.  Social History History  Substance Use Topics  . Smoking status: Current Every Day Smoker -- 1.00 packs/day    Types: Cigarettes  . Smokeless tobacco: Not on file  . Alcohol Use: No    Review of Systems Constitutional: No fever/chills Eyes: No visual changes. ENT: No sore throat. Cardiovascular: Denies chest pain. Respiratory: Denies shortness of breath. Gastrointestinal: No abdominal pain.  No nausea, no vomiting.  No diarrhea.  No constipation. Genitourinary: Negative for dysuria. Musculoskeletal: Negative for back pain. Skin: Negative for rash. Positive for lesion in discoloration right foot, third fourth digit. Neurological: Negative for headaches, focal weakness or numbness.  10-point ROS otherwise negative.  ____________________________________________   PHYSICAL EXAM:  VITAL SIGNS: ED Triage Vitals  Enc Vitals Group     BP 05/06/15 0923 116/63 mmHg     Pulse Rate 05/06/15 0923 77     Resp 05/06/15 0923 20     Temp 05/06/15 0923 97.9 F (36.6 C)     Temp Source 05/06/15 0923 Oral     SpO2 05/06/15 0923 100 %     Weight 05/06/15 0923 140 lb (63.504 kg)  Height 05/06/15 0923 5' 8.5" (1.74 m)     Head Cir --      Peak Flow --      Pain Score 05/06/15 0924 7     Pain Loc --      Pain Edu? --      Excl. in [place]? --     Constitutional: Alert and oriented. Well appearing and in no acute distress. Musculoskeletal: No lower extremity tenderness nor edema.  No joint effusions. Neurologic:  Normal speech and language. No gross focal neurologic deficits are appreciated. Speech is normal. No gait instability. Skin:  Skin is warm, dry and intact. No rash noted. Positive callus with tender lesion noted interdigitally right foot third and fourth toe. No active weeping no erythema noted. Psychiatric: Mood and affect are normal. Speech and behavior are normal.  ____________________________________________    LABS (all labs ordered are listed, but only abnormal results are displayed)  Labs Reviewed - No data to display ____________________________________________  EKG  None ____________________________________________  RADIOLOGY  Reviewed by radiologist, reviewed by myself. Negative. ____________________________________________   PROCEDURES  Procedure(s) performed: None  Critical Care performed: No  ____________________________________________   INITIAL IMPRESSION / ASSESSMENT AND PLAN / ED COURSE  Pertinent labs & imaging results that were available during my care of the patient were reviewed by me and considered in my medical decision making (see chart for details).  Clinical findings were discussed with the patient is a callus with the early infection probable fungal. Rx given for Lamisil cream over-the-counter. Patient agrees with treatment plan and will follow up with PCP and podiatry as soon as possible. ____________________________________________   FINAL CLINICAL IMPRESSION(S) / ED DIAGNOSES  Final diagnoses:  Tinea pedis, right      Arlyss Repress, PA-C 05/06/15 1033  Lisa Roca, MD 05/06/15 1525

## 2015-05-06 NOTE — Discharge Instructions (Signed)

## 2016-06-01 ENCOUNTER — Other Ambulatory Visit: Payer: Self-pay

## 2016-06-01 ENCOUNTER — Encounter: Payer: Self-pay | Admitting: Emergency Medicine

## 2016-06-01 ENCOUNTER — Emergency Department: Payer: BLUE CROSS/BLUE SHIELD

## 2016-06-01 ENCOUNTER — Emergency Department
Admission: EM | Admit: 2016-06-01 | Discharge: 2016-06-01 | Disposition: A | Discharge status: Home / Self Care | Payer: BLUE CROSS/BLUE SHIELD | Attending: Emergency Medicine | Admitting: Emergency Medicine

## 2016-06-01 DIAGNOSIS — F1490 Cocaine use, unspecified, uncomplicated: Secondary | ICD-10-CM | POA: Insufficient documentation

## 2016-06-01 DIAGNOSIS — Z79899 Other long term (current) drug therapy: Secondary | ICD-10-CM | POA: Diagnosis not present

## 2016-06-01 DIAGNOSIS — E119 Type 2 diabetes mellitus without complications: Secondary | ICD-10-CM | POA: Insufficient documentation

## 2016-06-01 DIAGNOSIS — Z792 Long term (current) use of antibiotics: Secondary | ICD-10-CM | POA: Diagnosis not present

## 2016-06-01 DIAGNOSIS — R0789 Other chest pain: Secondary | ICD-10-CM | POA: Diagnosis not present

## 2016-06-01 DIAGNOSIS — F1190 Opioid use, unspecified, uncomplicated: Secondary | ICD-10-CM | POA: Diagnosis not present

## 2016-06-01 DIAGNOSIS — F17210 Nicotine dependence, cigarettes, uncomplicated: Secondary | ICD-10-CM | POA: Insufficient documentation

## 2016-06-01 DIAGNOSIS — R0602 Shortness of breath: Secondary | ICD-10-CM | POA: Diagnosis present

## 2016-06-01 DIAGNOSIS — R079 Chest pain, unspecified: Secondary | ICD-10-CM

## 2016-06-01 LAB — CBC
HEMATOCRIT: 41.8 % (ref 35.0–47.0)
Hemoglobin: 13.9 g/dL (ref 12.0–16.0)
MCH: 27.6 pg (ref 26.0–34.0)
MCHC: 33.2 g/dL (ref 32.0–36.0)
MCV: 83 fL (ref 80.0–100.0)
PLATELETS: 260 10*3/uL (ref 150–440)
RBC: 5.04 MIL/uL (ref 3.80–5.20)
RDW: 14.4 % (ref 11.5–14.5)
WBC: 7.4 10*3/uL (ref 3.6–11.0)

## 2016-06-01 LAB — TSH: TSH: 0.838 u[IU]/mL (ref 0.350–4.500)

## 2016-06-01 LAB — TROPONIN I: Troponin I: <0.03 ng/mL (ref <0.031)

## 2016-06-01 LAB — BASIC METABOLIC PANEL
Anion gap: 7 (ref 5–15)
BUN: 11 mg/dL (ref 6–20)
CO2: 27 mmol/L (ref 22–32)
Calcium: 9.1 mg/dL (ref 8.9–10.3)
Chloride: 106 mmol/L (ref 101–111)
Creatinine, Ser: 0.87 mg/dL (ref 0.44–1.00)
GFR calc non Af Amer: >60 mL/min (ref >60)
Glucose, Bld: 109 mg/dL — ABNORMAL HIGH (ref 65–99)
POTASSIUM: 4 mmol/L (ref 3.5–5.1)
Sodium: 140 mmol/L (ref 135–145)

## 2016-06-01 MED ORDER — IOPAMIDOL (ISOVUE-370) INJECTION 76%
100.0000 mL | Freq: Once | INTRAVENOUS | Status: AC | PRN
  Administered 2016-06-01: 100 mL via INTRAVENOUS
  Filled 2016-06-01: qty 100

## 2016-06-01 MED ORDER — NAPROXEN 500 MG PO TABS: 500.0000 mg | ORAL_TABLET | Freq: Two times a day (BID) | ORAL | Status: AC

## 2016-06-01 MED ORDER — PREDNISONE 20 MG PO TABS: 40.0000 mg | ORAL_TABLET | Freq: Every day | ORAL | Status: AC

## 2016-06-01 MED ORDER — LORAZEPAM 0.5 MG PO TABS: 0.5000 mg | ORAL_TABLET | Freq: Once | ORAL | Status: DC

## 2016-06-01 MED ORDER — PENTAFLUOROPROP-TETRAFLUOROETH EX AERO
INHALATION_SPRAY | CUTANEOUS | Status: AC
  Administered 2016-06-01: 30 via TOPICAL
  Filled 2016-06-01: qty 30

## 2016-06-01 MED ORDER — SODIUM CHLORIDE 0.9 % IV BOLUS (SEPSIS)
1000.0000 mL | Freq: Once | INTRAVENOUS | Status: AC
  Administered 2016-06-01: 1000 mL via INTRAVENOUS

## 2016-06-01 MED ORDER — PENTAFLUOROPROP-TETRAFLUOROETH EX AERO
INHALATION_SPRAY | CUTANEOUS | Status: DC | PRN
  Administered 2016-06-01: 30 via TOPICAL
  Filled 2016-06-01: qty 103.5

## 2016-06-01 MED ORDER — RANITIDINE HCL 150 MG PO CAPS: 150.0000 mg | ORAL_CAPSULE | Freq: Two times a day (BID) | ORAL | Status: AC

## 2016-06-01 NOTE — ED Provider Notes (Signed)
[facility] Emergency Department Provider Note  ____________________________________________  Time seen: 2:25 PM  I have reviewed the triage vital signs and the nursing notes.   HISTORY  Chief Complaint Chest Pain and Facial Swelling    HPI Valerie Sanders is a 55 y.o. female who complains of central chest pain described as tightness, radiates to her back, constant for 2 days. Worse with deep breathing and coughing. Cough is nonproductive. Also reports some shortness of breath. No recent trauma. Not exertional. No vomiting or diaphoresis. No leg swelling, no recent travel, hospitalization or surgery. No history of DVT or PE.  Also complains of right facial swelling over the last few days as well. No fractured tooth or dental injuries. No throat swelling.     Past Medical History  Diagnosis Date  . Fibroids   . Diabetes mellitus without complication ([place])      There are no active problems to display for this patient.    Past Surgical History  Procedure Laterality Date  . Abdominal hysterectomy       Current Outpatient Rx  Name  Route  Sig  Dispense  Refill  . cephALEXin (KEFLEX) 500 MG capsule   Oral   Take 1 capsule (500 mg total) by mouth 4 (four) times daily.   40 capsule   0   . cyclobenzaprine (FLEXERIL) 10 MG tablet   Oral   Take 1 tablet (10 mg total) by mouth 2 (two) times daily as needed for muscle spasms.   20 tablet   0   . Diphenhyd-Hydrocort-Nystatin (FIRST-DUKES MOUTHWASH) SUSP   Mouth/Throat   Use as directed 10 mLs in the mouth or throat 4 (four) times daily as needed.   237 mL   0   . EXPIRED: famotidine (PEPCID) 20 MG tablet   Oral   Take 1 tablet (20 mg total) by mouth daily.   20 tablet   0   . ibuprofen (ADVIL,MOTRIN) 800 MG tablet   Oral   Take 1 tablet (800 mg total) by mouth every 8 (eight) hours as needed.   30 tablet   0   . naproxen (NAPROSYN) 500 MG tablet   Oral   Take 1 tablet (500 mg total) by  mouth 2 (two) times daily with a meal.   20 tablet   0   . predniSONE (DELTASONE) 20 MG tablet   Oral   Take 2 tablets (40 mg total) by mouth daily.   8 tablet   0   . ranitidine (ZANTAC) 150 MG capsule   Oral   Take 1 capsule (150 mg total) by mouth 2 (two) times daily.   28 capsule   0   . terbinafine (LAMISIL) 1 % cream   Topical   Apply 1 application topically 2 (two) times daily.   30 g   0      Allergies Review of patient's allergies indicates no known allergies.   No family history on file.  Social History Social History  Substance Use Topics  . Smoking status: Current Every Day Smoker -- 1.00 packs/day    Types: Cigarettes  . Smokeless tobacco: None  . Alcohol Use: No    Review of Systems  Constitutional:   No fever or chills.  Eyes:   No vision changes.  ENT:   No sore throat. No rhinorrhea. Cardiovascular:   Positive as above chest pain. Respiratory:   Positive shortness of breath and nonproductive cough. Gastrointestinal:   Negative for abdominal pain,  vomiting and diarrhea.  Genitourinary:   Negative for dysuria or difficulty urinating. Musculoskeletal:   Negative for focal pain or swelling Neurological:   Negative for headaches 10-point ROS otherwise negative.  ____________________________________________   PHYSICAL EXAM:  VITAL SIGNS: ED Triage Vitals  Enc Vitals Group     BP 06/01/16 1233 115/70 mmHg     Pulse Rate 06/01/16 1233 101     Resp 06/01/16 1233 20     Temp 06/01/16 1233 98 F (36.7 C)     Temp Source 06/01/16 1233 Oral     SpO2 06/01/16 1233 100 %     Weight 06/01/16 1233 159 lb (72.122 kg)     Height 06/01/16 1233 5\' 8"  (1.727 m)     Head Cir --      Peak Flow --      Pain Score 06/01/16 1234 8     Pain Loc --      Pain Edu? --      Excl. in [place]? --     Vital signs reviewed, nursing assessments reviewed.   Constitutional:   Alert and oriented. Well appearing and in no distress. Eyes:   No scleral icterus. No  conjunctival pallor. PERRL. EOMI.  No nystagmus. ENT   Head:   Normocephalic and atraumatic.There is very slight swelling about the right parotid gland. Slight tenderness in this area as well. No significant inflammatory changes..   Nose:   No congestion/rhinnorhea. No septal hematoma   Mouth/Throat:   MMM, no pharyngeal erythema. No peritonsillar mass.    Neck:   No stridor. No SubQ emphysema. No meningismus. Hematological/Lymphatic/Immunilogical:   No cervical lymphadenopathy. Cardiovascular:   RRR. Symmetric bilateral radial and DP pulses.  No murmurs.  Respiratory:   Normal respiratory effort without tachypnea nor retractions. Breath sounds are clear and equal bilaterally. No wheezes/rales/rhonchi. No inducible wheezing or coughing with forceful rapid expiration. Gastrointestinal:   Soft and nontender. Non distended. There is no CVA tenderness.  No rebound, rigidity, or guarding. Genitourinary:   deferred Musculoskeletal:   Nontender with normal range of motion in all extremities. No joint effusions.  No lower extremity tenderness.  No edema. Chest wall nontender Neurologic:   Normal speech and language.  CN 2-10 normal. Motor grossly intact. No gross focal neurologic deficits are appreciated.  Skin:    Skin is warm, dry and intact. No rash noted.  No petechiae, purpura, or bullae.  ____________________________________________    LABS (pertinent positives/negatives) (all labs ordered are listed, but only abnormal results are displayed) Labs Reviewed  BASIC METABOLIC PANEL - Abnormal; Notable for the following:    Glucose, Bld 109 (*)    All other components within normal limits  CBC  TROPONIN I  TSH   ____________________________________________   EKG  Normal sinus rhythm rate of 76, normal axis intervals QRS and ST segments. Isolated T-wave inversion in aVL which is nonspecific.  ____________________________________________    RADIOLOGY Chest x-ray  unremarkable CT angiogram chest unremarkable.  ____________________________________________   PROCEDURES   ____________________________________________   INITIAL IMPRESSION / ASSESSMENT AND PLAN / ED COURSE  Pertinent labs & imaging results that were available during my care of the patient were reviewed by me and considered in my medical decision making (see chart for details).  Patient presents with pleuritic chest pain, concerning for pulmonary embolism given the nature of this pain despite the absence of any serious risk factors. X-ray and labs are unremarkable, auscultation is unremarkable. Proceeded with a CT angiogram which was  negative for PE, did not find any other cause for her pain. Start the patient on a trial of prednisone and naproxen in case this is bronchitis, Zantac for GI protection in the setting of these medications. Patient is ambulatory, calm and comfortable. Discussed the nondiagnostic nature of her workup and need for primary care follow-up. She'll follow-up with her primary care doctor this week.     ____________________________________________   FINAL CLINICAL IMPRESSION(S) / ED DIAGNOSES  Final diagnoses:  Nonspecific chest pain       Portions of this note were generated with dragon dictation software. Dictation errors may occur despite best attempts at proofreading.   Carrie Mew, MD 06/01/16 1700

## 2016-06-01 NOTE — Discharge Instructions (Signed)
Nonspecific Chest Pain  °Chest pain can be caused by many different conditions. There is always a chance that your pain could be related to something serious, such as a heart attack or a blood clot in your lungs. Chest pain can also be caused by conditions that are not life-threatening. If you have chest pain, it is very important to follow up with your health care provider. °CAUSES  °Chest pain can be caused by: °· Heartburn. °· Pneumonia or bronchitis. °· Anxiety or stress. °· Inflammation around your heart (pericarditis) or lung (pleuritis or pleurisy). °· A blood clot in your lung. °· A collapsed lung (pneumothorax). It can develop suddenly on its own (spontaneous pneumothorax) or from trauma to the chest. °· Shingles infection (varicella-zoster virus). °· Heart attack. °· Damage to the bones, muscles, and cartilage that make up your chest wall. This can include: °¨ Bruised bones due to injury. °¨ Strained muscles or cartilage due to frequent or repeated coughing or overwork. °¨ Fracture to one or more ribs. °¨ Sore cartilage due to inflammation (costochondritis). °RISK FACTORS  °Risk factors for chest pain may include: °· Activities that increase your risk for trauma or injury to your chest. °· Respiratory infections or conditions that cause frequent coughing. °· Medical conditions or overeating that can cause heartburn. °· Heart disease or family history of heart disease. °· Conditions or health behaviors that increase your risk of developing a blood clot. °· Having had chicken pox (varicella zoster). °SIGNS AND SYMPTOMS °Chest pain can feel like: °· Burning or tingling on the surface of your chest or deep in your chest. °· Crushing, pressure, aching, or squeezing pain. °· Dull or sharp pain that is worse when you move, cough, or take a deep breath. °· Pain that is also felt in your back, neck, shoulder, or arm, or pain that spreads to any of these areas. °Your chest pain may come and go, or it may stay  constant. °DIAGNOSIS °Lab tests or other studies may be needed to find the cause of your pain. Your health care provider may have you take a test called an ambulatory ECG (electrocardiogram). An ECG records your heartbeat patterns at the time the test is performed. You may also have other tests, such as: °· Transthoracic echocardiogram (TTE). During echocardiography, sound waves are used to create a picture of all of the heart structures and to look at how blood flows through your heart. °· Transesophageal echocardiogram (TEE). This is a more advanced imaging test that obtains images from inside your body. It allows your health care provider to see your heart in finer detail. °· Cardiac monitoring. This allows your health care provider to monitor your heart rate and rhythm in real time. °· Holter monitor. This is a portable device that records your heartbeat and can help to diagnose abnormal heartbeats. It allows your health care provider to track your heart activity for several days, if needed. °· Stress tests. These can be done through exercise or by taking medicine that makes your heart beat more quickly. °· Blood tests. °· Imaging tests. °TREATMENT  °Your treatment depends on what is causing your chest pain. Treatment may include: °· Medicines. These may include: °¨ Acid blockers for heartburn. °¨ Anti-inflammatory medicine. °¨ Pain medicine for inflammatory conditions. °¨ Antibiotic medicine, if an infection is present. °¨ Medicines to dissolve blood clots. °¨ Medicines to treat coronary artery disease. °· Supportive care for conditions that do not require medicines. This may include: °¨ Resting. °¨ Applying heat   or cold packs to injured areas. °¨ Limiting activities until pain decreases. °HOME CARE INSTRUCTIONS °· If you were prescribed an antibiotic medicine, finish it all even if you start to feel better. °· Avoid any activities that bring on chest pain. °· Do not use any tobacco products, including  cigarettes, chewing tobacco, or electronic cigarettes. If you need help quitting, ask your health care provider. °· Do not drink alcohol. °· Take medicines only as directed by your health care provider. °· Keep all follow-up visits as directed by your health care provider. This is important. This includes any further testing if your chest pain does not go away. °· If heartburn is the cause for your chest pain, you may be told to keep your head raised (elevated) while sleeping. This reduces the chance that acid will go from your stomach into your esophagus. °· Make lifestyle changes as directed by your health care provider. These may include: °¨ Getting regular exercise. Ask your health care provider to suggest some activities that are safe for you. °¨ Eating a heart-healthy diet. A registered dietitian can help you to learn healthy eating options. °¨ Maintaining a healthy weight. °¨ Managing diabetes, if necessary. °¨ Reducing stress. °SEEK MEDICAL CARE IF: °· Your chest pain does not go away after treatment. °· You have a rash with blisters on your chest. °· You have a fever. °SEEK IMMEDIATE MEDICAL CARE IF:  °· Your chest pain is worse. °· You have an increasing cough, or you cough up blood. °· You have severe abdominal pain. °· You have severe weakness. °· You faint. °· You have chills. °· You have sudden, unexplained chest discomfort. °· You have sudden, unexplained discomfort in your arms, back, neck, or jaw. °· You have shortness of breath at any time. °· You suddenly start to sweat, or your skin gets clammy. °· You feel nauseous or you vomit. °· You suddenly feel light-headed or dizzy. °· Your heart begins to beat quickly, or it feels like it is skipping beats. °These symptoms may represent a serious problem that is an emergency. Do not wait to see if the symptoms will go away. Get medical help right away. Call your local emergency services (911 in the [place] Do not drive yourself to the hospital. °  °This  information is not intended to replace advice given to you by your health care provider. Make sure you discuss any questions you have with your health care provider. °  °Document Released: 09/07/2005 Document Revised: 12/19/2014 Document Reviewed: 07/04/2014 °Elsevier Interactive Patient Education ©2016 [employer] ° °

## 2016-06-01 NOTE — ED Notes (Signed)
Pt to ED today with central chest pain described as tightness, radiates to back, that started 2 days ago.  Pt states that it is worse with deep breath.  Mild dyspnea in triage, but  Pt appears to be in no apparent distress in triage, laughing and joking.  Pt also appears with R facial swelling.

## 2016-06-01 NOTE — ED Notes (Signed)
MD at bedside. 

## 2016-10-14 ENCOUNTER — Ambulatory Visit (HOSPITAL_COMMUNITY)
Admission: EM | Admit: 2016-10-14 | Discharge: 2016-10-14 | Disposition: A | Discharge status: Home / Self Care | Payer: BLUE CROSS/BLUE SHIELD | Attending: Family Medicine | Admitting: Family Medicine

## 2016-10-14 ENCOUNTER — Encounter (HOSPITAL_COMMUNITY): Payer: Self-pay | Admitting: Emergency Medicine

## 2016-10-14 DIAGNOSIS — K047 Periapical abscess without sinus: Secondary | ICD-10-CM

## 2016-10-14 DIAGNOSIS — K029 Dental caries, unspecified: Secondary | ICD-10-CM | POA: Diagnosis not present

## 2016-10-14 MED ORDER — PENICILLIN V POTASSIUM 500 MG PO TABS: 500.0000 mg | ORAL_TABLET | Freq: Three times a day (TID) | ORAL | 0 refills | Status: AC

## 2016-10-14 NOTE — Discharge Instructions (Signed)
Dr. Pat Patrick would be a great dentist.  She is gentle and friendly.  Call her at [phone]   Her office is near [facility]

## 2016-10-14 NOTE — ED Triage Notes (Signed)
Here for lower right dental pain onset 2 days  Also c/o right ear pain  A&O x4... NAD

## 2016-10-14 NOTE — ED Provider Notes (Signed)
[place]    CSN: [id number] Arrival date & time: 10/14/16  1450     History   Chief Complaint Chief Complaint  Patient presents with  . Dental Pain    HPI Valerie Sanders is a 55 y.o. female.   Is a 55 year old woman who works at [employer]. She presents with several days of increasing pain and swelling in the right jaw area. She has a broken tooth #28      Past Medical History:  Diagnosis Date  . Diabetes mellitus without complication ([place])   . Fibroids     There are no active problems to display for this patient.   Past Surgical History:  Procedure Laterality Date  . ABDOMINAL HYSTERECTOMY      OB History    No data available       Home Medications    Prior to Admission medications   Medication Sig Start Date End Date Taking? Authorizing Provider  Diphenhyd-Hydrocort-Nystatin (FIRST-DUKES MOUTHWASH) SUSP Use as directed 10 mLs in the mouth or throat 4 (four) times daily as needed. 11/09/11   Melynda Ripple, MD  famotidine (PEPCID) 20 MG tablet Take 1 tablet (20 mg total) by mouth daily. 11/09/11 11/08/12  Melynda Ripple, MD  ibuprofen (ADVIL,MOTRIN) 800 MG tablet Take 1 tablet (800 mg total) by mouth every 8 (eight) hours as needed. 05/06/15   Arlyss Repress, PA-C  naproxen (NAPROSYN) 500 MG tablet Take 1 tablet (500 mg total) by mouth 2 (two) times daily with a meal. 06/01/16   Carrie Mew, MD  penicillin v potassium (VEETID) 500 MG tablet Take 1 tablet (500 mg total) by mouth 3 (three) times daily. 10/14/16   Robyn Haber, MD  predniSONE (DELTASONE) 20 MG tablet Take 2 tablets (40 mg total) by mouth daily. 06/01/16   Carrie Mew, MD  ranitidine (ZANTAC) 150 MG capsule Take 1 capsule (150 mg total) by mouth 2 (two) times daily. 06/01/16   Carrie Mew, MD  terbinafine (LAMISIL) 1 % cream Apply 1 application topically 2 (two) times daily. 05/06/15   Arlyss Repress, PA-C    Family History History reviewed. No pertinent  family history.  Social History Social History  Substance Use Topics  . Smoking status: Current Every Day Smoker    Packs/day: 1.00    Types: Cigarettes  . Smokeless tobacco: Never Used  . Alcohol use No     Allergies   Review of patient's allergies indicates no known allergies.   Review of Systems Review of Systems  Constitutional: Negative.   HENT: Negative for drooling and sore throat.   Respiratory: Negative.   Cardiovascular: Negative.      Physical Exam Triage Vital Signs ED Triage Vitals  Enc Vitals Group     BP 10/14/16 1533 122/81     Pulse Rate 10/14/16 1533 68     Resp 10/14/16 1533 14     Temp 10/14/16 1533 97.8 F (36.6 C)     Temp Source 10/14/16 1533 Oral     SpO2 10/14/16 1533 100 %     Weight --      Height --      Head Circumference --      Peak Flow --      Pain Score 10/14/16 1544 10     Pain Loc --      Pain Edu? --      Excl. in [place]? --    No data found.   Updated Vital Signs BP 122/81 (  BP Location: Right Arm)   Pulse 68   Temp 97.8 F (36.6 C) (Oral)   Resp 14   SpO2 100%    Physical Exam  Constitutional: She is oriented to person, place, and time. She appears well-developed and well-nourished.  HENT:  Head: Normocephalic and atraumatic.  Right Ear: External ear normal.  Left Ear: External ear normal.  Swollen right jaw which is tender over tooth #28  Eyes: Conjunctivae and EOM are normal. Pupils are equal, round, and reactive to light.  Neck: Normal range of motion. Neck supple.  Musculoskeletal: Normal range of motion.  Neurological: She is alert and oriented to person, place, and time.  Skin: Skin is warm and dry.  Psychiatric: She has a normal mood and affect.  Nursing note and vitals reviewed.    UC Treatments / Results  Labs (all labs ordered are listed, but only abnormal results are displayed) Labs Reviewed - No data to display  EKG  EKG Interpretation None       Radiology No results  found.  Procedures Procedures (including critical care time)  Medications Ordered in UC Medications - No data to display   Initial Impression / Assessment and Plan / UC Course  I have reviewed the triage vital signs and the nursing notes.  Pertinent labs & imaging results that were available during my care of the patient were reviewed by me and considered in my medical decision making (see chart for details).  Clinical Course    Final Clinical Impressions(s) / UC Diagnoses   Final diagnoses:  Dental caries  Dental abscess    New Prescriptions New Prescriptions   PENICILLIN V POTASSIUM (VEETID) 500 MG TABLET    Take 1 tablet (500 mg total) by mouth 3 (three) times daily.     Robyn Haber, MD 10/14/16 1549

## 2017-01-10 ENCOUNTER — Encounter (HOSPITAL_COMMUNITY): Payer: Self-pay | Admitting: Emergency Medicine

## 2017-01-10 ENCOUNTER — Emergency Department (HOSPITAL_COMMUNITY)
Admission: EM | Admit: 2017-01-10 | Discharge: 2017-01-10 | Disposition: A | Discharge status: Home / Self Care | Payer: BLUE CROSS/BLUE SHIELD | Attending: Emergency Medicine | Admitting: Emergency Medicine

## 2017-01-10 DIAGNOSIS — E119 Type 2 diabetes mellitus without complications: Secondary | ICD-10-CM | POA: Insufficient documentation

## 2017-01-10 DIAGNOSIS — J111 Influenza due to unidentified influenza virus with other respiratory manifestations: Secondary | ICD-10-CM | POA: Diagnosis not present

## 2017-01-10 DIAGNOSIS — Z79899 Other long term (current) drug therapy: Secondary | ICD-10-CM | POA: Diagnosis not present

## 2017-01-10 DIAGNOSIS — R69 Illness, unspecified: Secondary | ICD-10-CM

## 2017-01-10 DIAGNOSIS — F17210 Nicotine dependence, cigarettes, uncomplicated: Secondary | ICD-10-CM | POA: Diagnosis not present

## 2017-01-10 DIAGNOSIS — R05 Cough: Secondary | ICD-10-CM | POA: Diagnosis present

## 2017-01-10 MED ORDER — OSELTAMIVIR PHOSPHATE 75 MG PO CAPS: 75.0000 mg | ORAL_CAPSULE | Freq: Two times a day (BID) | ORAL | 0 refills | Status: AC

## 2017-01-10 MED ORDER — BENZONATATE 100 MG PO CAPS: 100.0000 mg | ORAL_CAPSULE | Freq: Three times a day (TID) | ORAL | 0 refills | Status: AC

## 2017-01-10 MED ORDER — IBUPROFEN 600 MG PO TABS: 600.0000 mg | ORAL_TABLET | Freq: Four times a day (QID) | ORAL | 0 refills | Status: AC | PRN

## 2017-01-10 NOTE — ED Provider Notes (Signed)
[facility] DEPT Provider Note   CSN: [id number] Arrival date & time: 01/10/17  0946     History   Chief Complaint Chief Complaint  Patient presents with  . Influenza    HPI Valerie Sanders is a 56 y.o. female.  HPI   Valerie Sanders is a 56 y.o. female, with a history of DM, presenting to the ED with nonproductive cough, congestion, diarrhea, body aches, chills, headache, and fatigue beginning yesterday. Patient has not taken any medications for her symptoms. She denies N/V, shortness of breath, chest pain, abdominal pain, or any other complaints.      Past Medical History:  Diagnosis Date  . Diabetes mellitus without complication ([place])   . Fibroids     There are no active problems to display for this patient.   Past Surgical History:  Procedure Laterality Date  . ABDOMINAL HYSTERECTOMY      OB History    No data available       Home Medications    Prior to Admission medications   Medication Sig Start Date End Date Taking? Authorizing Provider  benzonatate (TESSALON) 100 MG capsule Take 1 capsule (100 mg total) by mouth every 8 (eight) hours. 01/10/17   Kendyn Zaman C Dorea Duff, PA-C  Diphenhyd-Hydrocort-Nystatin (FIRST-DUKES MOUTHWASH) SUSP Use as directed 10 mLs in the mouth or throat 4 (four) times daily as needed. 11/09/11   Melynda Ripple, MD  famotidine (PEPCID) 20 MG tablet Take 1 tablet (20 mg total) by mouth daily. 11/09/11 11/08/12  Melynda Ripple, MD  ibuprofen (ADVIL,MOTRIN) 600 MG tablet Take 1 tablet (600 mg total) by mouth every 6 (six) hours as needed for fever, headache, mild pain or moderate pain. 01/10/17   Odie Rauen C Zeynab Klett, PA-C  ibuprofen (ADVIL,MOTRIN) 800 MG tablet Take 1 tablet (800 mg total) by mouth every 8 (eight) hours as needed. 05/06/15   Arlyss Repress, PA-C  naproxen (NAPROSYN) 500 MG tablet Take 1 tablet (500 mg total) by mouth 2 (two) times daily with a meal. 06/01/16   Carrie Mew, MD  oseltamivir (TAMIFLU) 75 MG capsule Take 1 capsule (75  mg total) by mouth every 12 (twelve) hours. 01/10/17   Mekiah Cambridge C Tonnya Garbett, PA-C  penicillin v potassium (VEETID) 500 MG tablet Take 1 tablet (500 mg total) by mouth 3 (three) times daily. 10/14/16   Robyn Haber, MD  predniSONE (DELTASONE) 20 MG tablet Take 2 tablets (40 mg total) by mouth daily. 06/01/16   Carrie Mew, MD  ranitidine (ZANTAC) 150 MG capsule Take 1 capsule (150 mg total) by mouth 2 (two) times daily. 06/01/16   Carrie Mew, MD  terbinafine (LAMISIL) 1 % cream Apply 1 application topically 2 (two) times daily. 05/06/15   Arlyss Repress, PA-C    Family History No family history on file.  Social History Social History  Substance Use Topics  . Smoking status: Current Every Day Smoker    Packs/day: 1.00    Types: Cigarettes  . Smokeless tobacco: Never Used  . Alcohol use No     Allergies   Patient has no known allergies.   Review of Systems Review of Systems  Constitutional: Positive for chills. Negative for fever.  HENT: Positive for congestion. Negative for sore throat and trouble swallowing.   Respiratory: Positive for cough. Negative for shortness of breath.   Cardiovascular: Negative for chest pain.  Gastrointestinal: Positive for diarrhea. Negative for abdominal pain, nausea and vomiting.  Musculoskeletal: Negative for neck stiffness.  All other systems reviewed and are negative.  Physical Exam Updated Vital Signs BP 110/66   Pulse 99   Temp 98.6 F (37 C) (Oral)   Resp 16   SpO2 97%   Physical Exam  Constitutional: She appears well-developed and well-nourished. No distress.  HENT:  Head: Normocephalic and atraumatic.  Mouth/Throat: Oropharynx is clear and moist.  Eyes: Conjunctivae are normal.  Neck: Normal range of motion. Neck supple.  Cardiovascular: Normal rate, regular rhythm, normal heart sounds and intact distal pulses.   Pulmonary/Chest: Effort normal and breath sounds normal. No respiratory distress.  Abdominal: Soft. There is no  tenderness. There is no guarding.  Musculoskeletal: She exhibits no edema.  Lymphadenopathy:    She has no cervical adenopathy.  Neurological: She is alert.  Skin: Skin is warm and dry. She is not diaphoretic.  Psychiatric: She has a normal mood and affect. Her behavior is normal.  Nursing note and vitals reviewed.    ED Treatments / Results  Labs (all labs ordered are listed, but only abnormal results are displayed) Labs Reviewed - No data to display  EKG  EKG Interpretation None       Radiology No results found.  Procedures Procedures (including critical care time)  Medications Ordered in ED Medications - No data to display   Initial Impression / Assessment and Plan / ED Course  I have reviewed the triage vital signs and the nursing notes.  Pertinent labs & imaging results that were available during my care of the patient were reviewed by me and considered in my medical decision making (see chart for details).     Patient presents with influenza-like symptoms. She is nontoxic appearing. Pros and cons of Tamiflu discussed. PCP follow-up as needed. Home care and return precautions discussed. Patient voices understanding of all instructions and is comfortable with discharge.   Vitals:   01/10/17 0955 01/10/17 1132  BP: 110/66 101/56  Pulse: 99 88  Resp: 16 18  Temp: 98.6 F (37 C) 98.7 F (37.1 C)  TempSrc: Oral Oral  SpO2: 97% 98%   Note: Patient's significant other, Valerie Sanders, at the bedside is a 56 year old female with lung cancer receiving chemotherapy. He was written a prescription for Tamiflu to be used should he develop influenza-like symptoms.   Final Clinical Impressions(s) / ED Diagnoses   Final diagnoses:  Influenza-like illness    New Prescriptions Discharge Medication List as of 01/10/2017 11:22 AM    START taking these medications   Details  benzonatate (TESSALON) 100 MG capsule Take 1 capsule (100 mg total) by mouth every 8 (eight)  hours., Starting Tue 01/10/2017, Print    !! ibuprofen (ADVIL,MOTRIN) 600 MG tablet Take 1 tablet (600 mg total) by mouth every 6 (six) hours as needed for fever, headache, mild pain or moderate pain., Starting Tue 01/10/2017, Print    oseltamivir (TAMIFLU) 75 MG capsule Take 1 capsule (75 mg total) by mouth every 12 (twelve) hours., Starting Tue 01/10/2017, Print     !! - Potential duplicate medications found. Please discuss with provider.       Lorayne Bender, PA-C 01/10/17 [place], MD 01/10/17 1340

## 2017-01-10 NOTE — Discharge Instructions (Signed)
Your symptoms are consistent with a viral illness, such as the flu. Viruses do not require antibiotics. Treatment is symptomatic care and it is important to note that these symptoms may last for 7-14 days. Symptoms will be intensified and complicated by dehydration. Dehydration can also extend the duration of symptoms. Drink plenty of fluids and get plenty of rest. You should be drinking at least half a liter of water an hour to stay hydrated. Electrolyte drinks are also encouraged. You should be drinking enough fluids to make your urine light yellow, almost clear. If this is not the case, you are not drinking enough water. Ibuprofen, Naproxen, or Tylenol for pain or fever. Zofran for nausea. Tessalon for cough. Plain Mucinex may help relieve congestion. Saline sinus rinses and saline nasal sprays may also help relieve congestion. Warm liquids or Chloraseptic spray may help soothe a sore throat. Follow up with a primary care provider, as needed, for any future management of this issue.

## 2017-01-10 NOTE — ED Triage Notes (Signed)
Pt c/o flu like symptoms since yesterday, body aches, diarrhea, headache, cough, chest congestion. VSS, pt in NAD

## 2017-10-26 IMAGING — CT CT ANGIO CHEST
2 of 6 series · 19 of 46 positions shown · IV contrast (APPLIED)
Comparison: 06/01/2016 chest radiograph

CLINICAL DATA: Central chest pressure. Radiates to back. Dyspnea.
Right facial swelling. Diabetes.

EXAM:
CT ANGIOGRAPHY CHEST WITH CONTRAST
TECHNIQUE: Multidetector CT imaging of the chest was performed using the
standard protocol during bolus administration of intravenous
contrast. Multiplanar CT image reconstructions and MIPs were
obtained to evaluate the vascular anatomy.
CONTRAST:  100 cc of Isovue 370

[Series 5: pe thins 1.5 · axial · 0.68mm/px · z∈[-156,+104]mm · 16 of 243 slices shown]
[im 13/243  lung]
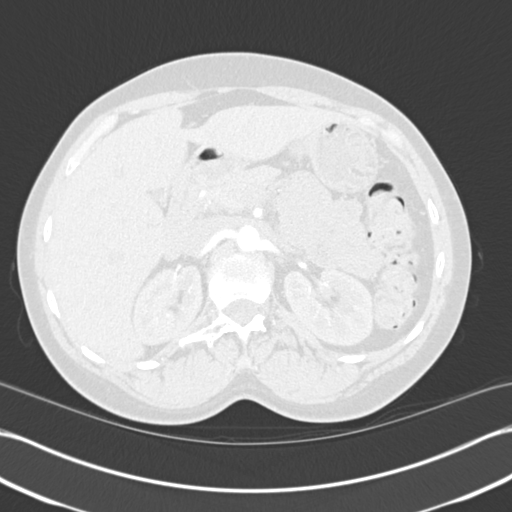
[im 26/243  soft-tissue]
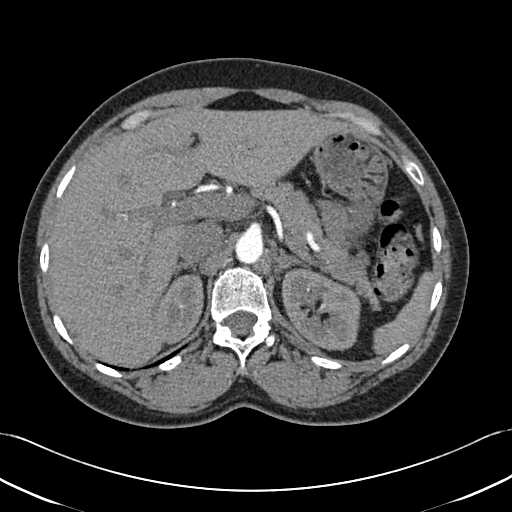
[im 39/243  lung]
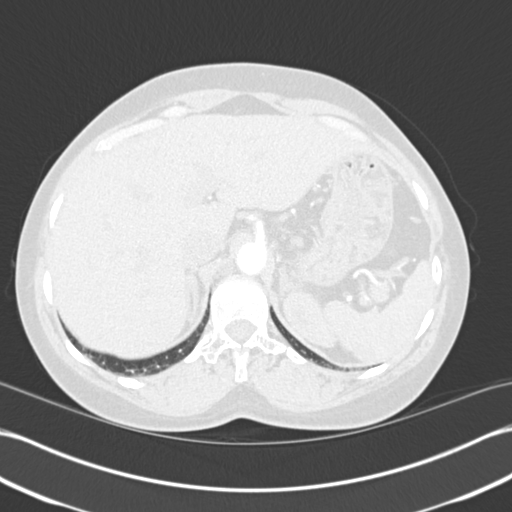
[im 51/243  soft-tissue]
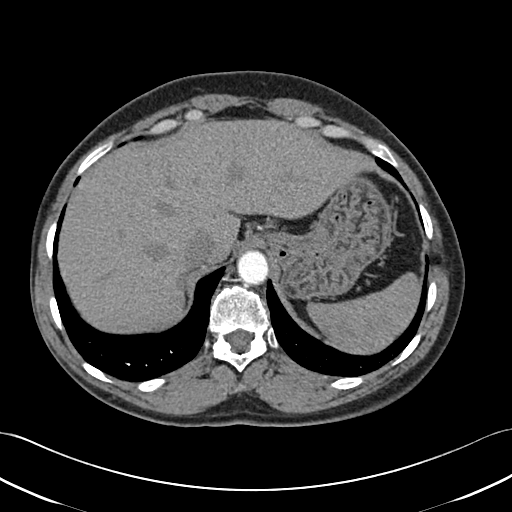
[im 77/243  lung]
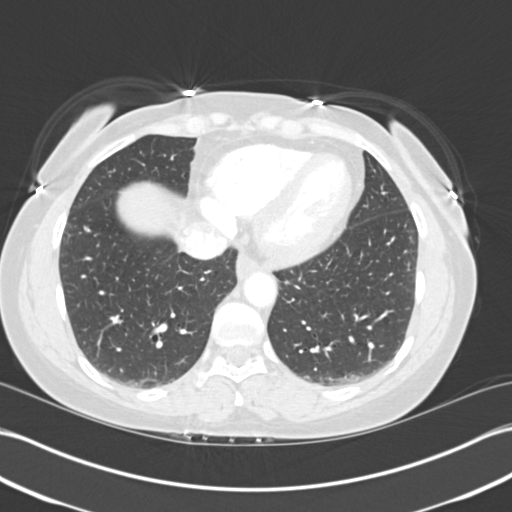
[im 90/243  soft-tissue]
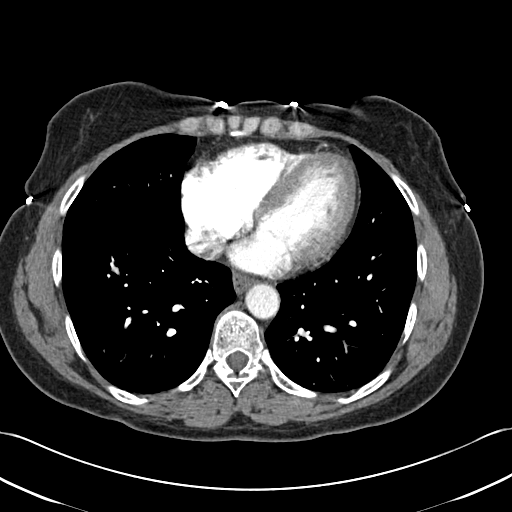
[im 102/243  lung]
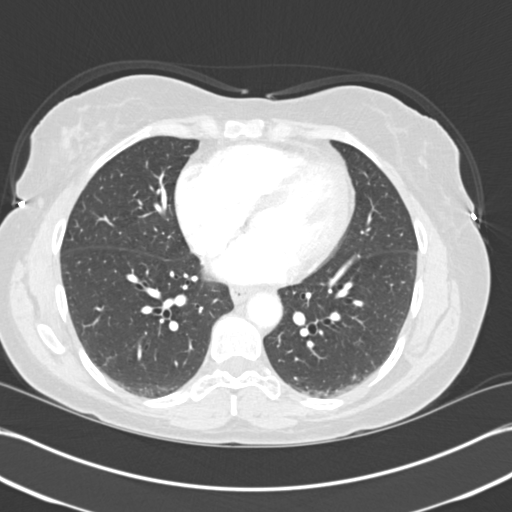
[im 115/243  soft-tissue]
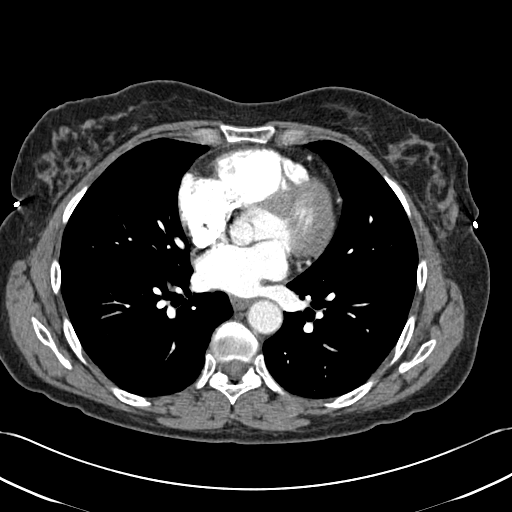
[im 128/243  lung]
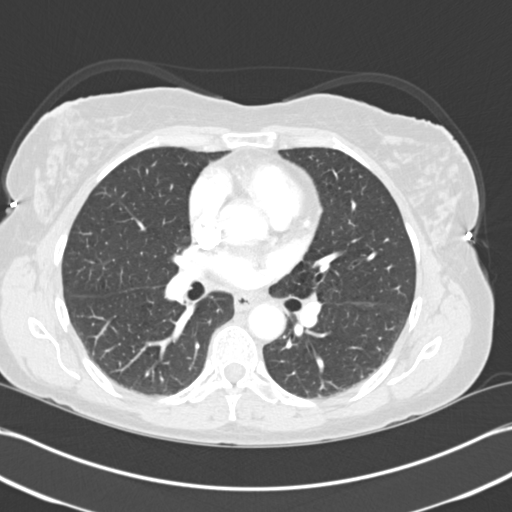
[im 141/243  soft-tissue]
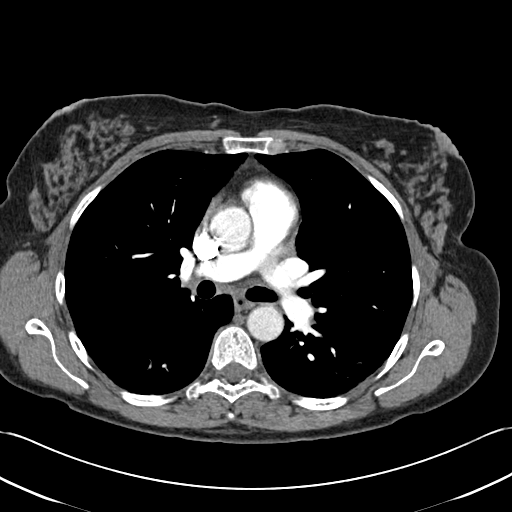
[im 153/243  lung]
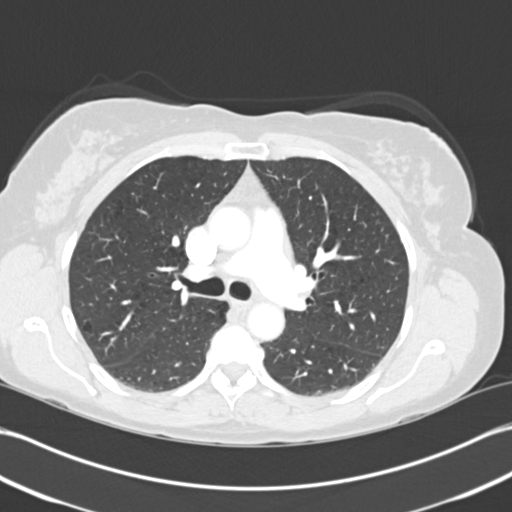
[im 166/243  soft-tissue]
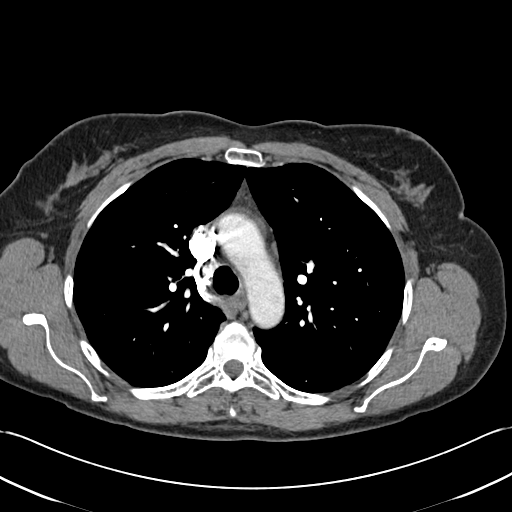
[im 192/243  lung]
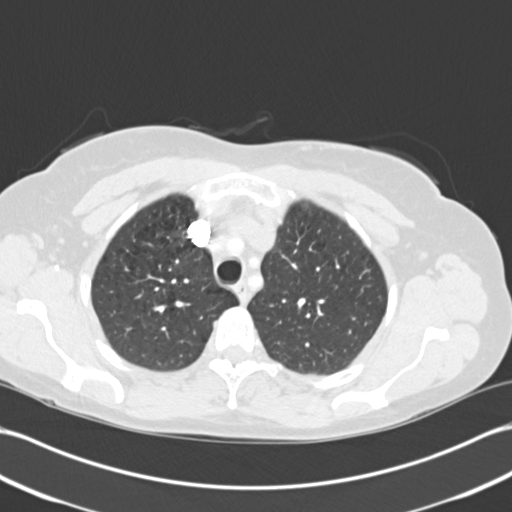
[im 204/243  soft-tissue]
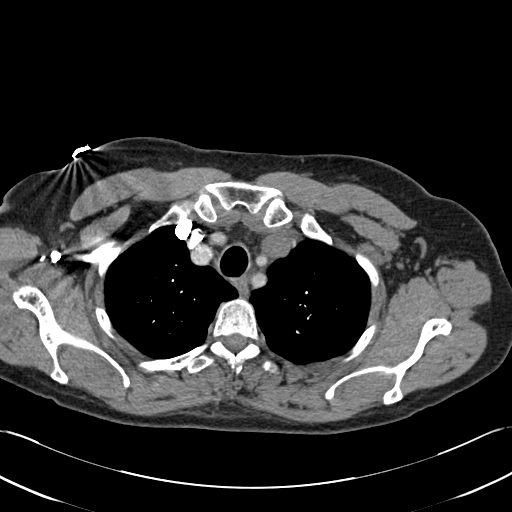
[im 217/243  lung]
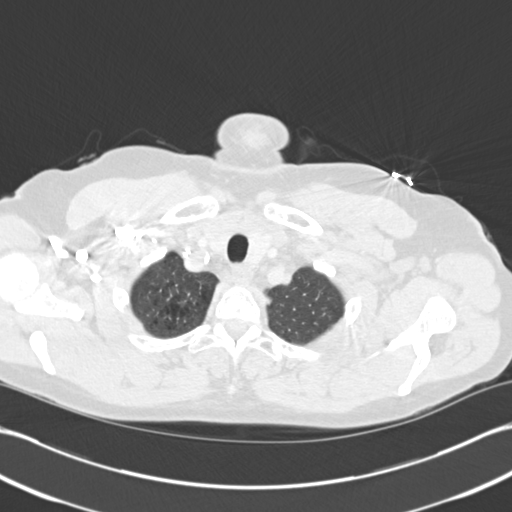
[im 230/243  soft-tissue]
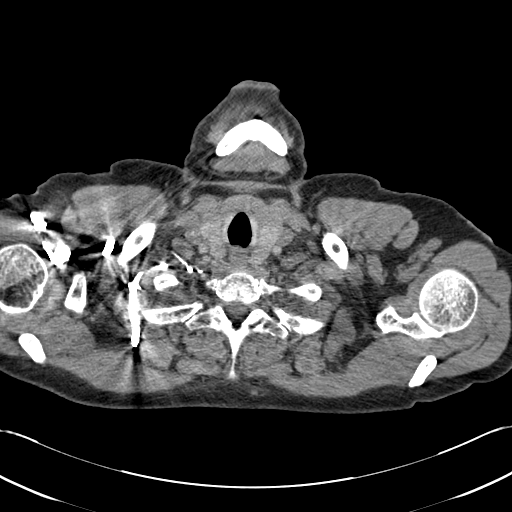

[Series 7: cor mpr 2.0 · coronal · 0.58mm/px · 3 of 133 slices shown]
[im 34/133  soft-tissue]
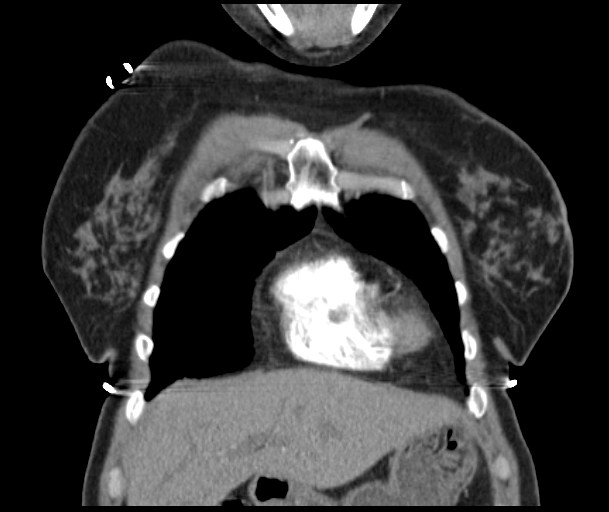
[im 67/133  soft-tissue]
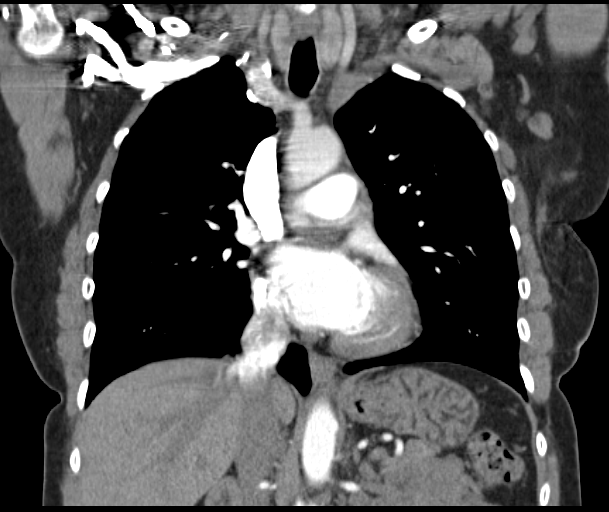
[im 100/133  soft-tissue]
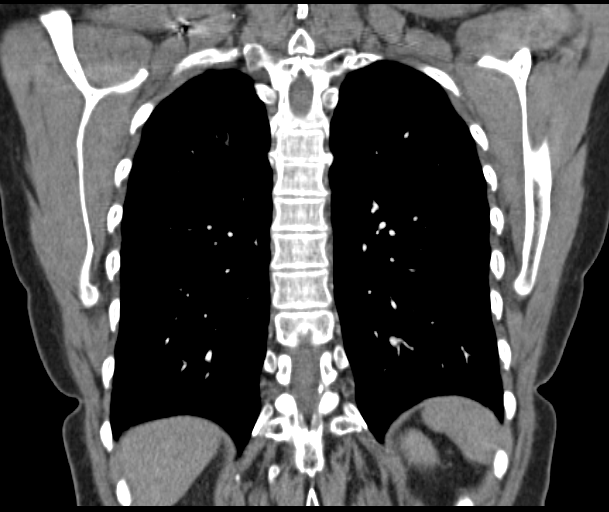

[19 of 46 positions shown; findings below may reference images not displayed]

FINDINGS: Mediastinum/Nodes: The quality of this exam for evaluation of
pulmonary embolism is good. No evidence of pulmonary embolism.
Normal aortic caliber without dissection. Normal heart size, without
pericardial effusion. Minimal residual thymus in the anterior
mediastinum.

Lungs/Pleura: No pleural fluid. Mild centrilobular emphysema.
Minimal motion degradation, including superiorly.

Upper abdomen: Normal imaged portions of the liver, spleen,
pancreas, adrenal glands, kidneys. Proximal gastric underdistention.
Apparent gastric wall thickening is favored to be secondary.

Musculoskeletal: No acute osseous abnormality.

Review of the MIP images confirms the above findings.
IMPRESSION: 1.  No evidence of pulmonary embolism.
2.  No acute process in the chest.
